# Patient Record
Sex: Female | Born: 2000 | Race: Black or African American | Hispanic: No | Marital: Single | Smoking: Never smoker
Health system: Southern US, Community
[De-identification: ages and names within clinical notes are randomized; demographics above are authoritative.]

## PROBLEM LIST (undated history)

## (undated) ENCOUNTER — Ambulatory Visit: Discharge status: Absent without leave | Payer: 59

## (undated) DIAGNOSIS — J45909 Unspecified asthma, uncomplicated: Secondary | ICD-10-CM

## (undated) DIAGNOSIS — S82899A Other fracture of unspecified lower leg, initial encounter for closed fracture: Secondary | ICD-10-CM

## (undated) HISTORY — DX: Unspecified asthma, uncomplicated: J45.909

## (undated) HISTORY — DX: Other fracture of unspecified lower leg, initial encounter for closed fracture: S82.899A

---
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
## 2020-01-05 ENCOUNTER — Ambulatory Visit (HOSPITAL_COMMUNITY)
Admission: RE | Admit: 2020-01-05 | Discharge: 2020-01-05 | Disposition: A | Discharge status: Home / Self Care | Payer: 59 | Source: Ambulatory Visit | Attending: Pediatrics | Admitting: Pediatrics

## 2020-01-05 ENCOUNTER — Other Ambulatory Visit: Payer: Self-pay

## 2020-01-05 ENCOUNTER — Ambulatory Visit (INDEPENDENT_AMBULATORY_CARE_PROVIDER_SITE_OTHER): Payer: 59 | Admitting: Pediatrics

## 2020-01-05 VITALS — BP 128/84 | Temp 99.0°F | Wt 199.4 lb

## 2020-01-05 DIAGNOSIS — R0789 Other chest pain: Secondary | ICD-10-CM | POA: Diagnosis not present

## 2020-01-05 DIAGNOSIS — J45909 Unspecified asthma, uncomplicated: Secondary | ICD-10-CM | POA: Diagnosis not present

## 2020-01-05 DIAGNOSIS — J4521 Mild intermittent asthma with (acute) exacerbation: Secondary | ICD-10-CM

## 2020-01-05 DIAGNOSIS — J984 Other disorders of lung: Secondary | ICD-10-CM | POA: Diagnosis not present

## 2020-01-05 DIAGNOSIS — J301 Allergic rhinitis due to pollen: Secondary | ICD-10-CM

## 2020-01-05 MED ORDER — PREDNISONE 20 MG PO TABS: ORAL_TABLET | ORAL | 0 refills | Status: DC

## 2020-01-05 MED ORDER — MONTELUKAST SODIUM 10 MG PO TABS: 10.0000 mg | ORAL_TABLET | Freq: Every day | ORAL | 2 refills | Status: AC

## 2020-01-06 ENCOUNTER — Encounter: Payer: Self-pay | Admitting: Pediatrics

## 2020-01-06 DIAGNOSIS — R0789 Other chest pain: Secondary | ICD-10-CM | POA: Insufficient documentation

## 2020-01-06 DIAGNOSIS — J301 Allergic rhinitis due to pollen: Secondary | ICD-10-CM | POA: Insufficient documentation

## 2020-01-06 NOTE — Progress Notes (Signed)
Subjective:     Patient ID: Kari Mays, female   DOB: 12/06/2000, 19 y.o.   MRN: [id number]  Chief Complaint  Patient presents with  . Cough  . Chest Pain    HPI: Patient is here for asthma exacerbations.  She states that yesterday, she had an asthma exacerbation where she had to use her albuterol inhaler.  She states that these severe exacerbations do not occur as frequently.  She states yesterday she did take her albuterol inhaler after which she felt better.  She does have asthma as well as allergies.  Kari Mays also states that she has had some pain on the right rib area.  She states that when she had these pain, she had been diagnosed with pneumonia.  She states she was diagnosed with pneumonia 2 weeks ago and therefore the pain was there as well.  She states that the pain had decreased, however with these exacerbations, the pains recur.  Upon reviewing records, noted that patient was seen on May 7 for cough and chest tightness.  She was diagnosed with allergic rhinitis as well as asthma.  She was placed on albuterol, prednisone as well as montelukast.  She was again reevaluated on May 27th for cough and using albuterol inhaler as well.  She apparently had chest tightness at that time.  She was taking her Zyrtec and singular as well.  However she was not on any controller medications for her asthma.  On that date of service, she was diagnosed with asthma exacerbation as well as community-acquired pneumonia and placed on Zithromax.  And asked to return to the clinic if there is any worsening of her symptoms.  She was again seen in the clinic on June 3 for continued right-sided chest pain with breathing and right ear pain.  She was diagnosed with costochondritis started on ibuprofen and asked to continue on her allergy medications.  Then on June 9 she was seen at the office for follow-up of pneumonia as well as cough.  She apparently was recovering and doing well.  And continued on allergy  medications.  Today in regards to medications, she states that she takes her Zyrtec and her Flonase.  She also has albuterol as well as her Flovent.  However she states she has not been taking her Singulair.  She has not been evaluated by a allergist in the past in regards to her asthma or her allergies.  She also states that she has not had this length of continued problems with her asthma.  Upon further questioning, she denies smoking, however she does state that she is vaping.  She states the last time she vaped was actually this morning.  Past Medical History:  Diagnosis Date  . Asthma   . Fracture, ankle 2018     Family History  Problem Relation Age of Onset  . Migraines Mother   . ADD / ADHD Brother     Social History   Tobacco Use  . Smoking status: Never Smoker  . Smokeless tobacco: Never Used  Substance Use Topics  . Alcohol use: No   Social History   Social History Narrative   Shanaiya attends 12 th grade at [employer]. She is doing well.   Plays basketball    Lives with her parents and two younger brothers.    Outpatient Encounter Medications as of 01/05/2020  Medication Sig  . albuterol (PROAIR HFA) 108 (90 Base) MCG/ACT inhaler 2 puffs every 4 to 6 hours as needed  for wheezing or coughing. Take one inhaler to school  . cetirizine (ZYRTEC) 10 MG tablet Take 1 tablet (10 mg total) by mouth daily.  . fluticasone (FLONASE) 50 MCG/ACT nasal spray Place 1 spray into both nostrils daily for 14 days.  . fluticasone (FLOVENT HFA) 110 MCG/ACT inhaler Inhale 2 puffs into the lungs 2 (two) times daily.  Marland Kitchen ibuprofen (ADVIL) 600 MG tablet Take 1 tablet (600 mg total) by mouth every 6 (six) hours as needed.  . Polyethylene Glycol 3350 POWD 17 grams in 8 ounces in juice or water twice a day for one to two days, once a day as needed  . amitriptyline (ELAVIL) 25 MG tablet Take 1 tablet (25 mg total) by mouth at bedtime. (Take 12.5 mg daily at bedtime for the first week)  (Patient not taking: Reported on 07/11/2016)  . azithromycin (ZITHROMAX Z-PAK) 250 MG tablet Take 2 tabs on day 1, days 2-5 1 tab daily (Patient not taking: Reported on 01/05/2020)  . montelukast (SINGULAIR) 10 MG tablet Take 1 tablet (10 mg total) by mouth at bedtime.  . predniSONE (DELTASONE) 20 MG tablet 2 tabs p.o. daily x 4 days.  . [DISCONTINUED] montelukast (SINGULAIR) 10 MG tablet Take 1 tablet (10 mg total) by mouth at bedtime. (Patient not taking: Reported on 01/05/2020)  . [DISCONTINUED] predniSONE (DELTASONE) 20 MG tablet Take 3 tablets on day one, then 2 tablets once a day for 4 more days (Patient not taking: Reported on 01/05/2020)   Facility-Administered Encounter Medications as of 01/05/2020  Medication  . albuterol (VENTOLIN HFA) 108 (90 Base) MCG/ACT inhaler 4 puff    Patient has no known allergies.    ROS:  Apart from the symptoms reviewed above, there are no other symptoms referable to all systems reviewed.   Physical Examination   Wt Readings from Last 3 Encounters:  01/05/20 199 lb 6.4 oz (90.4 kg) (97 %, Z= 1.94)*  12/30/19 197 lb (89.4 kg) (97 %, Z= 1.91)*  12/24/19 198 lb 4 oz (89.9 kg) (97 %, Z= 1.93)*   * Growth percentiles are based on CDC (Girls, 2-20 Years) data.   BP Readings from Last 3 Encounters:  01/05/20 128/84  08/03/19 126/78  01/12/19 124/87   Body mass index is 36.47 kg/m. 98 %ile (Z= 2.01) based on CDC (Girls, 2-20 Years) BMI-for-age data using weight from 01/05/2020 and height from 01/12/2019. Blood pressure percentiles are not available for patients who are 18 years or older.    General: Alert, NAD,  HEENT: TM's - clear, Throat - clear, Neck - FROM, no meningismus, Sclera - clear LYMPH NODES: No lymphadenopathy noted LUNGS: Clear to auscultation bilaterally,  no wheezing or crackles noted CV: RRR without Murmurs ABD: Soft, NT, positive bowel signs,  No hepatosplenomegaly noted GU: Not examined SKIN: Clear, No rashes  noted NEUROLOGICAL: Grossly intact MUSCULOSKELETAL: Reproducible pain on the right thorax. Psychiatric: Affect normal, non-anxious   Rapid Strep A Screen  Date Value Ref Range Status  08/08/2018 Negative Negative Final     DG Chest 2 View  Result Date: 01/05/2020 CLINICAL DATA:  Asthma exacerbation EXAM: CHEST - 2 VIEW COMPARISON:  10/09/2005 FINDINGS: The heart size and mediastinal contours are within normal limits. Both lungs are clear. The visualized skeletal structures are unremarkable. IMPRESSION: No active cardiopulmonary disease. Electronically Signed   By: Donavan Foil M.D.   On: 01/05/2020 23:47    No results found for this or any previous visit (from the past 240 hour(s)).  No results  found for this or any previous visit (from the past 48 hour(s)).  Assessment:  1. Mild intermittent asthma with acute exacerbation 2.  Allergic rhinitis 3.  Costochondritis    Plan:   1.  Patient is here for exacerbation of her asthma.  Discussed at length with her in regards to this.  She states that she was outside over the weekend and wonders if that could have led to the exacerbation.  She states that she had a severe exacerbation which she normally does not have.  Therefore discussed at length with her that she needs to be consistent on her medications.  She states that she is not using her Flovent inhaler.  She states she tends to use it only when she is afraid that there may be an impending exacerbation.  She asks if she should be taking the Flovent inhaler every day.  I asked her if she tends to have her asthma exacerbations during spring, fall, both or at any time.  She is not quite able to answer these questions.  Therefore discussed with her that if during allergy seasons in the spring, she tends to have exacerbations of her asthma more so during that period of time, I would recommend Flovent be used every single day.  The albuterol is a rescue, therefore should be used when she does  have the asthma exacerbations.  However, also stressed that she needs to make sure that she is also taking her allergy medications co that nsistently as well.  I noted that she did not state that she had been taking her Singulair, however in the past, states that she did take her Singulair.  She apparently did not know what the medication Singulair is nor what it was used for. 2.  Discussed at length with Kari Mays that her allergies exacerbation can also lead to asthma exacerbation.  Therefore she needs to be consistent in taking her allergy medications as well.  Discussed with her the advantages of taking Singulair in regards to both asthma as well as allergy treatment.  She needs to continue on her routine allergy treatments as well.  Also discussed at length with Kari Mays at the side effects including the black box warning of the Singulair. 3.  Given the duration of the symptoms, also recommended that she have a chest x-ray performed to rule out any other abnormalities. 4.  Also discussed with patient that any form of vaping, smoking etc. will cause her to have an increased chances of asthma exacerbations and delay in recovery.  Discussed with her at length that vaping has shown to increase the occurrence of pneumonias as well as intake of harmful products into the lungs.  Discussed with her that since per her statement that she vapes occasionally, then she needs to stop vaping completely. 5.  I have restarted patient on Singulair and also have added 4 more days of prednisone.  I have told her that I will give her a call in regards to chest x-ray results.  However, she is to continue use her albuterol inhaler every 4-6 hours as needed.  She is also to use her Flovent 2 puffs twice a day as well.  She is also to continue with her allergy medications including the Singulair. 6.  Given the extent of this illness, I would like for her to come back in the next 4 days for reevaluation.  She is also 19 years of age,  therefore she needs to see an adult physician as well.  However in the meantime, we will continue to follow her until she is able to establish this care.  She may require referral to allergist as well. Spent 35 minutes with the patient face-to-face of which over 50% was in counseling in regards to evaluation and treatment of asthma, allergic rhinitis, and the dangers of vaping. Meds ordered this encounter  Medications  . montelukast (SINGULAIR) 10 MG tablet    Sig: Take 1 tablet (10 mg total) by mouth at bedtime.    Dispense:  30 tablet    Refill:  2  . predniSONE (DELTASONE) 20 MG tablet    Sig: 2 tabs p.o. daily x 4 days.    Dispense:  8 tablet    Refill:  0

## 2020-01-06 NOTE — Patient Instructions (Signed)
Asthma, Adult  Asthma is a long-term (chronic) condition in which the airways get tight and narrow. The airways are the breathing passages that lead from the nose and mouth down into the lungs. A person with asthma will have times when symptoms get worse. These are called asthma attacks. They can cause coughing, whistling sounds when you breathe (wheezing), shortness of breath, and chest pain. They can make it hard to breathe. There is no cure for asthma, but medicines and lifestyle changes can help control it. There are many things that can bring on an asthma attack or make asthma symptoms worse (triggers). Common triggers include:  Mold.  Dust.  Cigarette smoke.  Cockroaches.  Things that can cause allergy symptoms (allergens). These include animal skin flakes (dander) and pollen from trees or grass.  Things that pollute the air. These may include household cleaners, wood smoke, smog, or chemical odors.  Cold air, weather changes, and wind.  Crying or laughing hard.  Stress.  Certain medicines or drugs.  Certain foods such as dried fruit, potato chips, and grape juice.  Infections, such as a cold or the flu.  Certain medical conditions or diseases.  Exercise or tiring activities. Asthma may be treated with medicines and by staying away from the things that cause asthma attacks. Types of medicines may include:  Controller medicines. These help prevent asthma symptoms. They are usually taken every day.  Fast-acting reliever or rescue medicines. These quickly relieve asthma symptoms. They are used as needed and provide short-term relief.  Allergy medicines if your attacks are brought on by allergens.  Medicines to help control the body's defense (immune) system. Follow these instructions at home: Avoiding triggers in your home  Change your heating and air conditioning filter often.  Limit your use of fireplaces and wood stoves.  Get rid of pests (such as roaches and  mice) and their droppings.  Throw away plants if you see mold on them.  Clean your floors. Dust regularly. Use cleaning products that do not smell.  Have someone vacuum when you are not home. Use a vacuum cleaner with a HEPA filter if possible.  Replace carpet with wood, tile, or vinyl flooring. Carpet can trap animal skin flakes and dust.  Use allergy-proof pillows, mattress covers, and box spring covers.  Wash bed sheets and blankets every week in hot water. Dry them in a dryer.  Keep your bedroom free of any triggers.  Avoid pets and keep windows closed when things that cause allergy symptoms are in the air.  Use blankets that are made of polyester or cotton.  Clean bathrooms and kitchens with bleach. If possible, have someone repaint the walls in these rooms with mold-resistant paint. Keep out of the rooms that are being cleaned and painted.  Wash your hands often with soap and water. If soap and water are not available, use hand sanitizer.  Do not allow anyone to smoke in your home. General instructions  Take over-the-counter and prescription medicines only as told by your doctor. ? Talk with your doctor if you have questions about how or when to take your medicines. ? Make note if you need to use your medicines more often than usual.  Do not use any products that contain nicotine or tobacco, such as cigarettes and e-cigarettes. If you need help quitting, ask your doctor.  Stay away from secondhand smoke.  Avoid doing things outdoors when allergen counts are high and when air quality is low.  Wear a ski mask   when doing outdoor activities in the winter. The mask should cover your nose and mouth. Exercise indoors on cold days if you can.  Warm up before you exercise. Take time to cool down after exercise.  Use a peak flow meter as told by your doctor. A peak flow meter is a tool that measures how well the lungs are working.  Keep track of the peak flow meter's readings.  Write them down.  Follow your asthma action plan. This is a written plan for taking care of your asthma and treating your attacks.  Make sure you get all the shots (vaccines) that your doctor recommends. Ask your doctor about a flu shot and a pneumonia shot.  Keep all follow-up visits as told by your doctor. This is important. Contact a doctor if:  You have wheezing, shortness of breath, or a cough even while taking medicine to prevent attacks.  The mucus you cough up (sputum) is thicker than usual.  The mucus you cough up changes from clear or white to yellow, green, gray, or bloody.  You have problems from the medicine you are taking, such as: ? A rash. ? Itching. ? Swelling. ? Trouble breathing.  You need reliever medicines more than 2-3 times a week.  Your peak flow reading is still at 50-79% of your personal best after following the action plan for 1 hour.  You have a fever. Get help right away if:  You seem to be worse and are not responding to medicine during an asthma attack.  You are short of breath even at rest.  You get short of breath when doing very little activity.  You have trouble eating, drinking, or talking.  You have chest pain or tightness.  You have a fast heartbeat.  Your lips or fingernails start to turn blue.  You are light-headed or dizzy, or you faint.  Your peak flow is less than 50% of your personal best.  You feel too tired to breathe normally. Summary  Asthma is a long-term (chronic) condition in which the airways get tight and narrow. An asthma attack can make it hard to breathe.  Asthma cannot be cured, but medicines and lifestyle changes can help control it.  Make sure you understand how to avoid triggers and how and when to use your medicines. This information is not intended to replace advice given to you by your health care provider. Make sure you discuss any questions you have with your health care provider. Document Revised:  09/11/2018 Document Reviewed: 08/13/2016 [employer] Patient Education  2020 [employer]  

## 2020-01-18 ENCOUNTER — Ambulatory Visit: Payer: 59

## 2020-03-04 DIAGNOSIS — N63 Unspecified lump in unspecified breast: Secondary | ICD-10-CM | POA: Diagnosis not present

## 2020-04-26 ENCOUNTER — Encounter: Payer: Self-pay | Admitting: Pediatrics

## 2020-04-26 ENCOUNTER — Telehealth (INDEPENDENT_AMBULATORY_CARE_PROVIDER_SITE_OTHER): Payer: 59 | Admitting: Pediatrics

## 2020-04-26 DIAGNOSIS — J4521 Mild intermittent asthma with (acute) exacerbation: Secondary | ICD-10-CM

## 2020-04-26 MED ORDER — ALBUTEROL SULFATE HFA 108 (90 BASE) MCG/ACT IN AERS: INHALATION_SPRAY | RESPIRATORY_TRACT | 1 refills | Status: AC

## 2020-04-26 MED ORDER — AZITHROMYCIN 250 MG PO TABS: ORAL_TABLET | ORAL | 0 refills | Status: AC

## 2020-04-26 MED ORDER — PREDNISONE 20 MG PO TABS: ORAL_TABLET | ORAL | 0 refills | Status: AC

## 2020-04-26 NOTE — Progress Notes (Signed)
Virtual Visit via Telephone Note  I connected with Kari Mays on 04/26/20 at  3:45 PM EDT by telephone and verified that I am speaking with the correct person using two identifiers.   I discussed the limitations, risks, security and privacy concerns of performing an evaluation and management service by telephone and the availability of in person appointments. I also discussed with the patient that there may be a patient responsible charge related to this service. The patient expressed understanding and agreed to proceed.   History of Present Illness: She also has had runny nose and cough for the past one week. She was kicked out class for coughing so much today.  The patient has had chills intermitently at the start of the illness.  [facility] She is taking Mucinex DM and Daytime and Nightime tablets Nyquil. She is in college in [place] and does not have her albuterol with her there.   Observations/Objective: MD is in clinic Patient is in college in [place]   Assessment and Plan: .1. Mild intermittent asthma with acute exacerbation Discussed with patient to stop using any OTC medications with cough suppressant Use albuterol every 4 to 6 hours for the next 24 hours, then as needed for the next 2 to 3 days  - albuterol (PROAIR HFA) 108 (90 Base) MCG/ACT inhaler; 2 puffs every 4 to 6 hours as needed for wheezing or coughing  Dispense: 18 g; Refill: 1 - azithromycin (ZITHROMAX Z-PAK) 250 MG tablet; Take 2 tablets on day one, then 1 tablet once a day for 4 more days  Dispense: 6 each; Refill: 0 - predniSONE (DELTASONE) 20 MG tablet; Take 3 tablets on day one, then 2 tablets once a day for 2 more days  Dispense: 7 tablet; Refill: 0   Follow Up Instructions:    I discussed the assessment and treatment plan with the patient. The patient was provided an opportunity to ask questions and all were answered. The patient agreed with the plan and demonstrated  an understanding of the instructions.   The patient was advised to call back or seek an in-person evaluation if the symptoms worsen or if the condition fails to improve as anticipated.  I provided 9 minutes of non-face-to-face time during this encounter.   Rosiland Oz, MD

## 2020-05-04 DIAGNOSIS — N6310 Unspecified lump in the right breast, unspecified quadrant: Secondary | ICD-10-CM | POA: Diagnosis not present

## 2020-05-04 DIAGNOSIS — N6489 Other specified disorders of breast: Secondary | ICD-10-CM | POA: Diagnosis not present

## 2020-06-25 DIAGNOSIS — B2790 Infectious mononucleosis, unspecified without complication: Secondary | ICD-10-CM | POA: Diagnosis not present

## 2021-01-30 ENCOUNTER — Encounter: Payer: Self-pay | Admitting: Pediatrics

## 2021-02-22 DIAGNOSIS — Z6837 Body mass index (BMI) 37.0-37.9, adult: Secondary | ICD-10-CM | POA: Diagnosis not present

## 2021-02-22 DIAGNOSIS — Z113 Encounter for screening for infections with a predominantly sexual mode of transmission: Secondary | ICD-10-CM | POA: Diagnosis not present

## 2021-02-22 DIAGNOSIS — Z0000 Encounter for general adult medical examination without abnormal findings: Secondary | ICD-10-CM | POA: Diagnosis not present

## 2021-02-22 DIAGNOSIS — Z975 Presence of (intrauterine) contraceptive device: Secondary | ICD-10-CM | POA: Diagnosis not present

## 2021-12-29 DIAGNOSIS — R339 Retention of urine, unspecified: Secondary | ICD-10-CM | POA: Diagnosis not present

## 2021-12-29 DIAGNOSIS — J039 Acute tonsillitis, unspecified: Secondary | ICD-10-CM | POA: Diagnosis not present

## 2021-12-29 DIAGNOSIS — Z6838 Body mass index (BMI) 38.0-38.9, adult: Secondary | ICD-10-CM | POA: Diagnosis not present

## 2021-12-29 DIAGNOSIS — F17210 Nicotine dependence, cigarettes, uncomplicated: Secondary | ICD-10-CM | POA: Diagnosis not present

## 2021-12-29 DIAGNOSIS — R030 Elevated blood-pressure reading, without diagnosis of hypertension: Secondary | ICD-10-CM | POA: Diagnosis not present

## 2022-02-20 DIAGNOSIS — Z3046 Encounter for surveillance of implantable subdermal contraceptive: Secondary | ICD-10-CM | POA: Diagnosis not present

## 2022-03-15 DIAGNOSIS — Z3046 Encounter for surveillance of implantable subdermal contraceptive: Secondary | ICD-10-CM | POA: Diagnosis not present

## 2022-07-24 DIAGNOSIS — R059 Cough, unspecified: Secondary | ICD-10-CM | POA: Diagnosis not present

## 2022-07-24 DIAGNOSIS — J101 Influenza due to other identified influenza virus with other respiratory manifestations: Secondary | ICD-10-CM | POA: Diagnosis not present

## 2022-07-24 DIAGNOSIS — Z6837 Body mass index (BMI) 37.0-37.9, adult: Secondary | ICD-10-CM | POA: Diagnosis not present

## 2022-07-24 DIAGNOSIS — F17210 Nicotine dependence, cigarettes, uncomplicated: Secondary | ICD-10-CM | POA: Diagnosis not present

## 2022-07-24 DIAGNOSIS — R030 Elevated blood-pressure reading, without diagnosis of hypertension: Secondary | ICD-10-CM | POA: Diagnosis not present

## 2022-07-24 DIAGNOSIS — Z20828 Contact with and (suspected) exposure to other viral communicable diseases: Secondary | ICD-10-CM | POA: Diagnosis not present

## 2023-02-12 DIAGNOSIS — F411 Generalized anxiety disorder: Secondary | ICD-10-CM | POA: Diagnosis not present

## 2023-02-12 DIAGNOSIS — F330 Major depressive disorder, recurrent, mild: Secondary | ICD-10-CM | POA: Diagnosis not present

## 2023-03-12 DIAGNOSIS — F411 Generalized anxiety disorder: Secondary | ICD-10-CM | POA: Diagnosis not present

## 2023-03-12 DIAGNOSIS — F330 Major depressive disorder, recurrent, mild: Secondary | ICD-10-CM | POA: Diagnosis not present

## 2023-04-09 DIAGNOSIS — F411 Generalized anxiety disorder: Secondary | ICD-10-CM | POA: Diagnosis not present

## 2023-04-09 DIAGNOSIS — F330 Major depressive disorder, recurrent, mild: Secondary | ICD-10-CM | POA: Diagnosis not present

## 2024-01-03 DIAGNOSIS — Z6834 Body mass index (BMI) 34.0-34.9, adult: Secondary | ICD-10-CM | POA: Diagnosis not present

## 2024-01-03 DIAGNOSIS — J329 Chronic sinusitis, unspecified: Secondary | ICD-10-CM | POA: Diagnosis not present

## 2024-01-03 DIAGNOSIS — J029 Acute pharyngitis, unspecified: Secondary | ICD-10-CM | POA: Diagnosis not present
